# Patient Record
Sex: Male | Born: 2000 | Race: Black or African American | Hispanic: No | Marital: Single | Smoking: Never smoker
Health system: Southern US, Community
[De-identification: ages and names within clinical notes are randomized; demographics above are authoritative.]

## PROBLEM LIST (undated history)

## (undated) DIAGNOSIS — Q675 Congenital deformity of spine: Secondary | ICD-10-CM

## (undated) DIAGNOSIS — R7309 Other abnormal glucose: Secondary | ICD-10-CM

## (undated) DIAGNOSIS — R011 Cardiac murmur, unspecified: Secondary | ICD-10-CM

## (undated) HISTORY — DX: Cardiac murmur, unspecified: R01.1

## (undated) HISTORY — DX: Other abnormal glucose: R73.09

## (undated) HISTORY — DX: Congenital deformity of spine: Q67.5

---
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
## 2020-11-02 ENCOUNTER — Encounter (HOSPITAL_COMMUNITY): Payer: Self-pay | Admitting: Emergency Medicine

## 2020-11-02 ENCOUNTER — Emergency Department (HOSPITAL_COMMUNITY)
Admission: EM | Admit: 2020-11-02 | Discharge: 2020-11-02 | Disposition: A | Discharge status: Home / Self Care | Payer: Self-pay | Attending: Emergency Medicine | Admitting: Emergency Medicine

## 2020-11-02 DIAGNOSIS — H60502 Unspecified acute noninfective otitis externa, left ear: Secondary | ICD-10-CM | POA: Insufficient documentation

## 2020-11-02 MED ORDER — CIPROFLOXACIN-DEXAMETHASONE 0.3-0.1 % OT SUSP: 4.0000 [drp] | Freq: Two times a day (BID) | OTIC | 0 refills | Status: AC

## 2020-11-02 MED ORDER — CIPROFLOXACIN-DEXAMETHASONE 0.3-0.1 % OT SUSP
4.0000 [drp] | Freq: Once | OTIC | Status: AC
  Administered 2020-11-02: 4 [drp] via OTIC
  Filled 2020-11-02: qty 7.5

## 2020-11-02 MED ORDER — AMOXICILLIN-POT CLAVULANATE 875-125 MG PO TABS: 1.0000 | ORAL_TABLET | Freq: Two times a day (BID) | ORAL | 0 refills | Status: AC

## 2020-11-02 NOTE — ED Triage Notes (Signed)
Pt here from home with c/o left ear pain that started last night , no cough no fevers

## 2020-11-02 NOTE — Discharge Instructions (Addendum)
Use the drops and take the oral antibiotics  Use 4 drops to left ear 3 times a day  Follow up with primary care on Monday to ensure infection resolving.  Take Tylenol and ibuprofen for pain  Return for new or worsening symptoms

## 2020-11-02 NOTE — ED Provider Notes (Signed)
[facility] EMERGENCY DEPARTMENT Provider Note   CSN: [id number] Arrival date & time: 11/02/20  1953     History Left ear pain  Ryan Norris is a 20 y.o. male with no significant past medical history who presents for evaluation of left ear pain.  Has noticed some drainage.  No recent sick activities.  No headache, tinnitus, neck pain, neck stiffness.  No fever.  Pain is throbbing and aching.  Pain 6/10. No OTC meds.  Denies additional aggravating or alleviating factors.  History obtained from patient and past medial records.  HPI     Past Medical History:  Diagnosis Date   Congenital scoliosis    Elevated hemoglobin A1c    01/2015 A1c 6.2%   Murmur, cardiac    followed annually by [facility] cardiology    Patient Active Problem List   Diagnosis Date Noted   Prediabetes 03/27/2015    No past surgical history on file.     Family History  Problem Relation Age of Onset   Hypertension Mother    Diabetes Mother 103       treated with invokana   Diabetes Paternal Grandmother    Hypertension Paternal Grandmother    Hypertension Paternal Grandfather     Social History   Tobacco Use   Smoking status: Never   Smokeless tobacco: Never    Home Medications Prior to Admission medications   Medication Sig Start Date End Date Taking? Authorizing Provider  amoxicillin-clavulanate (AUGMENTIN) 875-125 MG tablet Take 1 tablet by mouth every 12 (twelve) hours. 11/02/20  Yes Eddye Broxterman A, PA-C  ciprofloxacin-dexamethasone (CIPRODEX) OTIC suspension Place 4 drops into the left ear 2 (two) times daily. 11/02/20  Yes Devone Bonilla A, PA-C  metFORMIN (GLUCOPHAGE XR) 750 MG 24 hr tablet Take 1 tablet (750 mg total) by mouth daily with breakfast. Patient not taking: Reported on 02/23/2019 11/30/18   Casimiro Needle, MD    Allergies    Patient has no known allergies.  Review of Systems   Review of Systems  Constitutional: Negative.   HENT:   Positive for ear discharge and ear pain. Negative for congestion, facial swelling, nosebleeds, postnasal drip, rhinorrhea, sinus pressure, sinus pain, sore throat, trouble swallowing and voice change.   Respiratory: Negative.    Cardiovascular: Negative.   Gastrointestinal: Negative.   Genitourinary: Negative.   Musculoskeletal: Negative.   Skin: Negative.   Neurological: Negative.   All other systems reviewed and are negative.  Physical Exam Updated Vital Signs BP (!) 143/81 (BP Location: Right Arm)   Pulse 71   Temp 98.7 F (37.1 C) (Oral)   Resp 18   SpO2 100%   Physical Exam Vitals and nursing note reviewed.  Constitutional:      General: He is not in acute distress.    Appearance: He is well-developed. He is not ill-appearing, toxic-appearing or diaphoretic.  HENT:     Head: Normocephalic and atraumatic.     Jaw: There is normal jaw occlusion.     Right Ear: Drainage, swelling and tenderness present. A middle ear effusion is present. Tympanic membrane is erythematous and bulging.     Left Ear: Tympanic membrane, ear canal and external ear normal.     Ears:     Comments: Right TM clear.  No middle ear effusion Left TM with yellow drainage.  Mild canal edema.  TM erythematous and bulging on the left.  Nontender bilateral mastoids.    Nose: Nose normal.  Mouth/Throat:     Mouth: Mucous membranes are moist.  Eyes:     Pupils: Pupils are equal, round, and reactive to light.  Cardiovascular:     Rate and Rhythm: Normal rate and regular rhythm.  Pulmonary:     Effort: Pulmonary effort is normal. No respiratory distress.  Abdominal:     General: There is no distension.     Palpations: Abdomen is soft.  Musculoskeletal:        General: Normal range of motion.     Cervical back: Normal range of motion and neck supple.  Skin:    General: Skin is warm and dry.  Neurological:     General: No focal deficit present.     Mental Status: He is alert and oriented to person,  place, and time.    ED Results / Procedures / Treatments   Labs (all labs ordered are listed, but only abnormal results are displayed) Labs Reviewed - No data to display  EKG None  Radiology No results found.  Procedures Procedures   Medications Ordered in ED Medications  ciprofloxacin-dexamethasone (CIPRODEX) 0.3-0.1 % OTIC (EAR) suspension 4 drop (has no administration in time range)    ED Course  I have reviewed the triage vital signs and the nursing notes.  Pertinent labs & imaging results that were available during my care of the patient were reviewed by me and considered in my medical decision making (see chart for details).  Here for evaluation of left ear pain.  Afebrile, nonseptic, not ill-appearing.  Right TM clear, nontender bilateral mastoids.  Left ear with otitis.  Given Ciprodex here.  Also given p.o. antibiotics. No canal occlusion, Exam non concerning for mastoiditis, cellulitis, meningitis or malignant OE.   The patient has been appropriately medically screened and/or stabilized in the ED. I have low suspicion for any other emergent medical condition which would require further screening, evaluation or treatment in the ED or require inpatient management.  Patient is hemodynamically stable and in no acute distress.  Patient able to ambulate in department prior to ED.  Evaluation does not show acute pathology that would require ongoing or additional emergent interventions while in the emergency department or further inpatient treatment.  I have discussed the diagnosis with the patient and answered all questions.  Pain is been managed while in the emergency department and patient has no further complaints prior to discharge.  Patient is comfortable with plan discussed in room and is stable for discharge at this time.  I have discussed strict return precautions for returning to the emergency department.  Patient was encouraged to follow-up with PCP/specialist refer to at  discharge.      MDM Rules/Calculators/A&P                            Final Clinical Impression(s) / ED Diagnoses Final diagnoses:  Acute otitis externa of left ear, unspecified type    Rx / DC Orders ED Discharge Orders          Ordered    amoxicillin-clavulanate (AUGMENTIN) 875-125 MG tablet  Every 12 hours        11/02/20 2016    ciprofloxacin-dexamethasone (CIPRODEX) OTIC suspension  2 times daily        11/02/20 2016             Adonia Porada A, PA-C 11/02/20 2017    Terald Sleeper, MD 11/02/20 2046
